# Patient Record
Sex: Female | Born: 1955 | Race: Black or African American | Hispanic: No | Marital: Single | Smoking: Never smoker
Health system: Southern US, Community
[De-identification: ages and names within clinical notes are randomized; demographics above are authoritative.]

## PROBLEM LIST (undated history)

## (undated) DIAGNOSIS — E7800 Pure hypercholesterolemia, unspecified: Secondary | ICD-10-CM

## (undated) DIAGNOSIS — I10 Essential (primary) hypertension: Secondary | ICD-10-CM

## (undated) DIAGNOSIS — M1990 Unspecified osteoarthritis, unspecified site: Secondary | ICD-10-CM

---

## 2013-09-14 ENCOUNTER — Encounter (HOSPITAL_COMMUNITY): Payer: Self-pay | Admitting: Emergency Medicine

## 2013-09-14 ENCOUNTER — Emergency Department (HOSPITAL_COMMUNITY)
Admission: EM | Admit: 2013-09-14 | Discharge: 2013-09-14 | Disposition: A | Discharge status: Home / Self Care | Payer: Medicaid Other | Attending: Emergency Medicine | Admitting: Emergency Medicine

## 2013-09-14 DIAGNOSIS — Z760 Encounter for issue of repeat prescription: Secondary | ICD-10-CM | POA: Diagnosis not present

## 2013-09-14 DIAGNOSIS — I10 Essential (primary) hypertension: Secondary | ICD-10-CM | POA: Diagnosis not present

## 2013-09-14 DIAGNOSIS — Z8639 Personal history of other endocrine, nutritional and metabolic disease: Secondary | ICD-10-CM | POA: Insufficient documentation

## 2013-09-14 DIAGNOSIS — Z862 Personal history of diseases of the blood and blood-forming organs and certain disorders involving the immune mechanism: Secondary | ICD-10-CM | POA: Diagnosis not present

## 2013-09-14 DIAGNOSIS — Z8739 Personal history of other diseases of the musculoskeletal system and connective tissue: Secondary | ICD-10-CM | POA: Diagnosis not present

## 2013-09-14 DIAGNOSIS — Z880 Allergy status to penicillin: Secondary | ICD-10-CM | POA: Diagnosis not present

## 2013-09-14 HISTORY — DX: Essential (primary) hypertension: I10

## 2013-09-14 HISTORY — DX: Pure hypercholesterolemia, unspecified: E78.00

## 2013-09-14 HISTORY — DX: Unspecified osteoarthritis, unspecified site: M19.90

## 2013-09-14 LAB — I-STAT CHEM 8, ED
BUN: 14 mg/dL (ref 6–23)
CHLORIDE: 104 meq/L (ref 96–112)
Calcium, Ion: 1.16 mmol/L (ref 1.12–1.23)
Creatinine, Ser: 1 mg/dL (ref 0.50–1.10)
Glucose, Bld: 108 mg/dL — ABNORMAL HIGH (ref 70–99)
HEMATOCRIT: 44 % (ref 36.0–46.0)
Hemoglobin: 15 g/dL (ref 12.0–15.0)
Potassium: 3.6 mEq/L — ABNORMAL LOW (ref 3.7–5.3)
Sodium: 140 mEq/L (ref 137–147)
TCO2: 23 mmol/L (ref 0–100)

## 2013-09-14 MED ORDER — AMLODIPINE BESYLATE 5 MG PO TABS: 5.0000 mg | ORAL_TABLET | Freq: Every day | ORAL | Status: AC

## 2013-09-14 MED ORDER — AMLODIPINE BESYLATE 5 MG PO TABS
5.0000 mg | ORAL_TABLET | Freq: Once | ORAL | Status: AC
  Administered 2013-09-14: 5 mg via ORAL
  Filled 2013-09-14 (×2): qty 1

## 2013-09-14 MED ORDER — SIMVASTATIN 20 MG PO TABS: 20.0000 mg | ORAL_TABLET | Freq: Every day | ORAL | Status: AC

## 2013-09-14 NOTE — ED Provider Notes (Signed)
Medical screening examination/treatment/procedure(s) were performed by non-physician practitioner and as supervising physician I was immediately available for consultation/collaboration.   EKG Interpretation None        David H Yao, MD 09/14/13 2349 

## 2013-09-14 NOTE — ED Notes (Signed)
EKG given to EDP, Yao, MD. For review. 

## 2013-09-14 NOTE — ED Notes (Signed)
Pt c/o increasing BP's x 2 weeks, pt states she has been increasing Lisinopril to 40mg s. Pt was taken off Lisinopril several months ago d/t rashes but is out of Amlodipine but has since ran out of that medication and cholesterol medication.  +HA, neck pain and blurred vision.

## 2013-09-14 NOTE — Discharge Instructions (Signed)
Please read and follow all provided instructions.  Your diagnoses today include:  1. Essential hypertension   2. Encounter for medication refill     Your blood pressure was high today (BP): BP 163/95   Pulse 69   Temp(Src) 98.7 F (37.1 C) (Oral)   Resp 17   Ht 5\' 3"  (1.6 m)   Wt 150 lb (68.04 kg)   BMI 26.58 kg/m2   SpO2 97%  Tests performed today include:  Vital signs. See below for your results today.   Medications prescribed:   Amlodipine - medication for high blood pressure   Simvastatin - medication for high cholesterol  Home care instructions:  Follow any educational materials contained in this packet.  Follow-up instructions: Please follow-up with your primary care provider in the next 7 days for a recheck of your symptoms and blood pressure.    Return instructions:   Please return to the Emergency Department if you experience worsening symptoms.   Return with severe chest pain, abdominal pain, or shortness of breath.   Return with severe headache, focal weakness, numbness, difficulty with speech or vision.  Return with loss of vision or sudden vision change.  Please return if you have any other emergent concerns.   Additional Information:  Your vital signs today were: BP 163/95   Pulse 69   Temp(Src) 98.7 F (37.1 C) (Oral)   Resp 17   Ht 5\' 3"  (1.6 m)   Wt 150 lb (68.04 kg)   BMI 26.58 kg/m2   SpO2 97% If your blood pressure (BP) was elevated above 135/85 this visit, please have this repeated by your doctor within one month. --------------

## 2013-09-14 NOTE — ED Provider Notes (Signed)
CSN: [id number]     Arrival date & time 09/14/13  1854 History   First MD Initiated Contact with Patient 09/14/13 1932     Chief Complaint  Patient presents with  . Hypertension     (Consider location/radiation/quality/duration/timing/severity/associated sxs/prior Treatment) HPI Comments: Patient with history of hypertension, hypercholesterolemia, osteoarthritis -- presents with complaint of elevated blood pressure. Patient has a 15 year history of hypertension and has been off amlodipine and simvastatin for 3 months. Patient had leftover lisinopril which she has been taking as a replacement. She has been monitoring her blood pressures closely and noted that they are still very elevated despite medication. This is what brings her to the emergency department tonight. Patient reports intermittent headaches for several weeks with blurry vision but no loss of vision. No thunderclap headache. No neck pain or fever. No significant chest pain or shortness of breath. No change in urination. Patient states that she has a primary care physician appointment on September 15 but was afraid to wait that long to restart her medications. The onset of this condition was acute. The course is constant. Aggravating factors: none. Alleviating factors: none.    Patient is a 58 y.o. female presenting with hypertension. The history is provided by the patient.  Hypertension Associated symptoms include headaches. Pertinent negatives include no abdominal pain, chest pain, coughing, fever, myalgias, nausea, neck pain, rash, sore throat or vomiting.    Past Medical History  Diagnosis Date  . Hypertension   . Hypercholesteremia   . Arthritis    History reviewed. No pertinent past surgical history. No family history on file. History  Substance Use Topics  . Smoking status: Never Smoker   . Smokeless tobacco: Not on file  . Alcohol Use: Yes     Comment: occasional   OB History   Grav Para Term Preterm Abortions  TAB SAB Ect Mult Living                 Review of Systems  Constitutional: Negative for fever.  HENT: Negative for rhinorrhea and sore throat.   Eyes: Positive for visual disturbance. Negative for redness.  Respiratory: Negative for cough.   Cardiovascular: Negative for chest pain.  Gastrointestinal: Negative for nausea, vomiting, abdominal pain and diarrhea.  Genitourinary: Negative for dysuria, frequency and difficulty urinating.  Musculoskeletal: Negative for myalgias and neck pain.  Skin: Negative for rash.  Neurological: Positive for headaches.   Allergies  Penicillins  Home Medications   Prior to Admission medications   Not on File   BP 193/111  Pulse 85  Temp(Src) 98.7 F (37.1 C) (Oral)  Resp 16  Ht 5\' 3"  (1.6 m)  Wt 150 lb (68.04 kg)  BMI 26.58 kg/m2  SpO2 99%  Physical Exam  Nursing note and vitals reviewed. Constitutional: She appears well-developed and well-nourished.  HENT:  Head: Normocephalic and atraumatic.  Eyes: Conjunctivae are normal. Right eye exhibits no discharge. Left eye exhibits no discharge.  Neck: Normal range of motion. Neck supple. No JVD present.  Cardiovascular: Normal rate, regular rhythm and normal heart sounds.   No murmur heard. Pulmonary/Chest: Effort normal and breath sounds normal. No respiratory distress. She has no wheezes. She has no rales.  Abdominal: Soft. There is no tenderness.  Neurological: She is alert.  Skin: Skin is warm and dry.  Psychiatric: She has a normal mood and affect.    ED Course  Procedures (including critical care time) Labs Review Labs Reviewed  I-STAT CHEM 8, ED - Abnormal; Notable  for the following:    Potassium 3.6 (*)    Glucose, Bld 108 (*)    All other components within normal limits    Imaging Review No results found.   EKG Interpretation None      8:13 PM Patient seen and examined. Work-up initiated. Medications ordered. Will check EKG and istat, will restart amlodipine and  simvastatin. PCP can titrate. Will have her avoid NSAIDs.   Vital signs reviewed and are as follows: BP 193/111  Pulse 85  Temp(Src) 98.7 F (37.1 C) (Oral)  Resp 16  Ht 5\' 3"  (1.6 m)  Wt 150 lb (68.04 kg)  BMI 26.58 kg/m2  SpO2 99%  9:36 PM EKG reviewed. No ischemic change. BP improved with amlodipine. Patient also notices improvement in HA and blurry vision. She is comfortable with d/c to home. Will give rx for simvastatin at home. She has follow-up on Sept 15th.   Patient urged to return with worsening symptoms or other concerns. Patient verbalized understanding and agrees with plan.   BP 163/95  Pulse 69  Temp(Src) 98.7 F (37.1 C) (Oral)  Resp 17  Ht 5\' 3"  (1.6 m)  Wt 150 lb (68.04 kg)  BMI 26.58 kg/m2  SpO2 97%   MDM   Final diagnoses:  Essential hypertension  Encounter for medication refill   HTN: No signs of end organ damage. HA but no deficits. Blurry vision improved with treatment, but no vision loss. No CP, SOB. BUN/creatinine OK. Will restart amlodipine. Continue lisinopril. Avoid NSAIDs and use tylenol for pain. Patient agrees.   No dangerous or life-threatening conditions suspected or identified by history, physical exam, and by work-up. No indications for hospitalization identified.      Renne Crigler, PA-C 09/14/13 2139

## 2014-02-12 ENCOUNTER — Other Ambulatory Visit: Payer: Self-pay | Admitting: Family Medicine

## 2014-02-12 DIAGNOSIS — N83201 Unspecified ovarian cyst, right side: Secondary | ICD-10-CM

## 2014-02-12 DIAGNOSIS — R1011 Right upper quadrant pain: Secondary | ICD-10-CM

## 2014-03-01 ENCOUNTER — Ambulatory Visit
Admission: RE | Admit: 2014-03-01 | Discharge: 2014-03-01 | Disposition: A | Discharge status: Home / Self Care | Payer: Medicaid Other | Source: Ambulatory Visit | Attending: Family Medicine | Admitting: Family Medicine

## 2014-03-01 ENCOUNTER — Other Ambulatory Visit: Payer: Self-pay | Admitting: Family Medicine

## 2014-03-01 DIAGNOSIS — R1011 Right upper quadrant pain: Secondary | ICD-10-CM

## 2014-03-01 DIAGNOSIS — N83201 Unspecified ovarian cyst, right side: Secondary | ICD-10-CM

## 2016-04-28 ENCOUNTER — Other Ambulatory Visit: Payer: Self-pay | Admitting: Family Medicine

## 2016-04-28 DIAGNOSIS — Z1231 Encounter for screening mammogram for malignant neoplasm of breast: Secondary | ICD-10-CM

## 2016-05-17 ENCOUNTER — Ambulatory Visit
Admission: RE | Admit: 2016-05-17 | Discharge: 2016-05-17 | Disposition: A | Discharge status: Home / Self Care | Payer: Medicare Other | Source: Ambulatory Visit | Attending: Family Medicine | Admitting: Family Medicine

## 2016-05-17 DIAGNOSIS — Z1231 Encounter for screening mammogram for malignant neoplasm of breast: Secondary | ICD-10-CM

## 2016-10-14 ENCOUNTER — Ambulatory Visit
Admission: RE | Admit: 2016-10-14 | Discharge: 2016-10-14 | Disposition: A | Discharge status: Home / Self Care | Payer: Medicare HMO | Source: Ambulatory Visit | Attending: Family Medicine | Admitting: Family Medicine

## 2016-10-14 ENCOUNTER — Other Ambulatory Visit: Payer: Self-pay | Admitting: Family Medicine

## 2016-10-14 DIAGNOSIS — M545 Low back pain: Secondary | ICD-10-CM

## 2016-11-17 ENCOUNTER — Ambulatory Visit
Admission: RE | Admit: 2016-11-17 | Discharge: 2016-11-17 | Disposition: A | Discharge status: Home / Self Care | Payer: Medicare HMO | Source: Ambulatory Visit | Attending: Allergy | Admitting: Allergy

## 2016-11-17 ENCOUNTER — Other Ambulatory Visit: Payer: Self-pay | Admitting: Allergy

## 2016-11-17 DIAGNOSIS — J309 Allergic rhinitis, unspecified: Secondary | ICD-10-CM

## 2019-08-15 ENCOUNTER — Other Ambulatory Visit: Payer: Self-pay | Admitting: Family Medicine

## 2019-08-15 DIAGNOSIS — Z1231 Encounter for screening mammogram for malignant neoplasm of breast: Secondary | ICD-10-CM

## 2019-08-24 ENCOUNTER — Other Ambulatory Visit: Payer: Self-pay

## 2019-08-24 ENCOUNTER — Ambulatory Visit
Admission: RE | Admit: 2019-08-24 | Discharge: 2019-08-24 | Disposition: A | Discharge status: Home / Self Care | Payer: Medicare HMO | Source: Ambulatory Visit | Attending: Family Medicine | Admitting: Family Medicine

## 2019-08-24 DIAGNOSIS — Z1231 Encounter for screening mammogram for malignant neoplasm of breast: Secondary | ICD-10-CM

## 2020-03-03 ENCOUNTER — Other Ambulatory Visit: Payer: Self-pay | Admitting: Family Medicine

## 2020-03-03 ENCOUNTER — Ambulatory Visit
Admission: RE | Admit: 2020-03-03 | Discharge: 2020-03-03 | Disposition: A | Discharge status: Home / Self Care | Payer: Medicare HMO | Source: Ambulatory Visit | Attending: Family Medicine | Admitting: Family Medicine

## 2020-03-03 DIAGNOSIS — M545 Low back pain, unspecified: Secondary | ICD-10-CM

## 2021-11-03 ENCOUNTER — Other Ambulatory Visit: Payer: Self-pay | Admitting: Family Medicine

## 2021-11-03 DIAGNOSIS — Z1231 Encounter for screening mammogram for malignant neoplasm of breast: Secondary | ICD-10-CM

## 2021-12-29 ENCOUNTER — Ambulatory Visit
Admission: RE | Admit: 2021-12-29 | Discharge: 2021-12-29 | Disposition: A | Discharge status: Home / Self Care | Payer: Medicare HMO | Source: Ambulatory Visit | Attending: Family Medicine | Admitting: Family Medicine

## 2021-12-29 DIAGNOSIS — Z1231 Encounter for screening mammogram for malignant neoplasm of breast: Secondary | ICD-10-CM

## 2022-04-20 ENCOUNTER — Other Ambulatory Visit: Payer: Self-pay | Admitting: Family Medicine

## 2022-04-20 DIAGNOSIS — R109 Unspecified abdominal pain: Secondary | ICD-10-CM

## 2022-05-18 ENCOUNTER — Other Ambulatory Visit: Payer: Medicare HMO

## 2022-06-02 ENCOUNTER — Ambulatory Visit
Admission: RE | Admit: 2022-06-02 | Discharge: 2022-06-02 | Disposition: A | Discharge status: Home / Self Care | Payer: Medicare HMO | Source: Ambulatory Visit | Attending: Family Medicine | Admitting: Family Medicine

## 2022-06-02 DIAGNOSIS — R109 Unspecified abdominal pain: Secondary | ICD-10-CM

## 2022-08-29 IMAGING — CR DG LUMBAR SPINE COMPLETE 4+V
5 series · 5 of 5 positions shown · non-contrast
Comparison: None.

CLINICAL DATA: Acute low back pain.

EXAM:
LUMBAR SPINE - COMPLETE 4+ VIEW

[t l-spine a.p.]
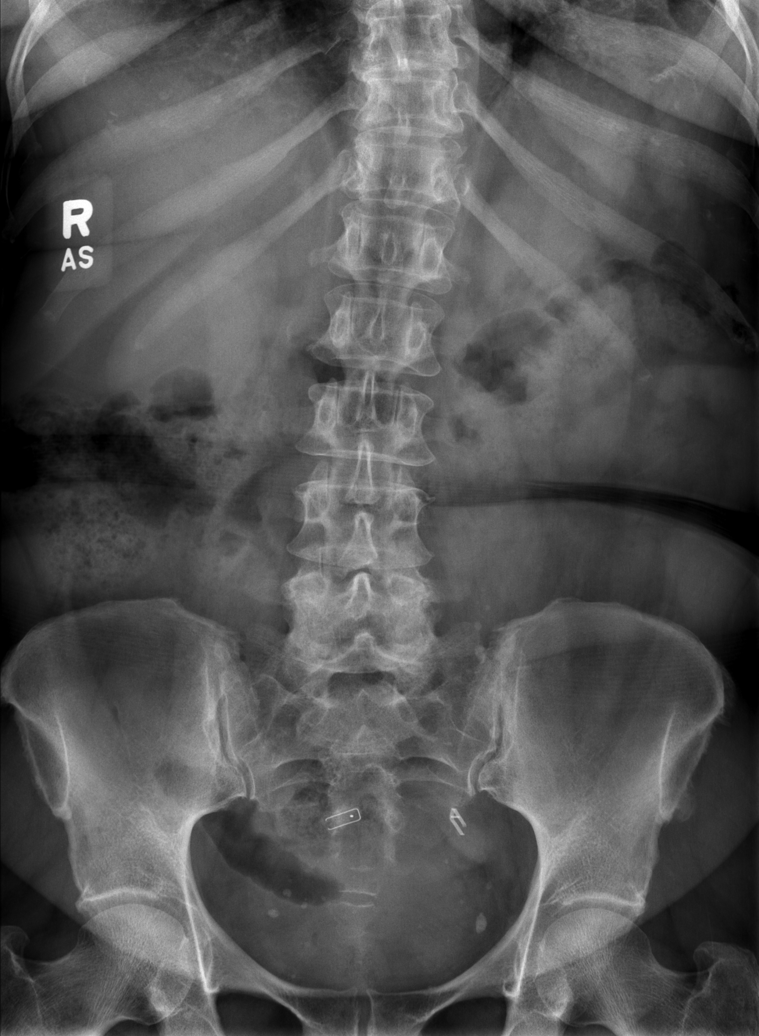

[t l-spine oblique exposure (1 of 2)]
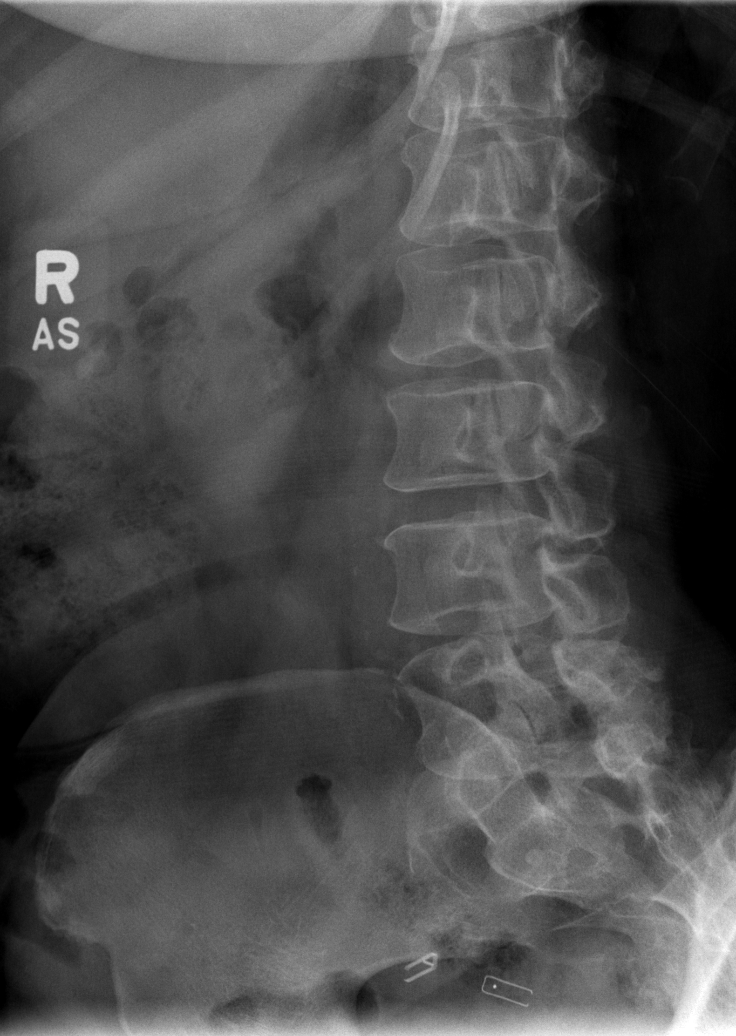

[t l-spine oblique exposure (2 of 2)]
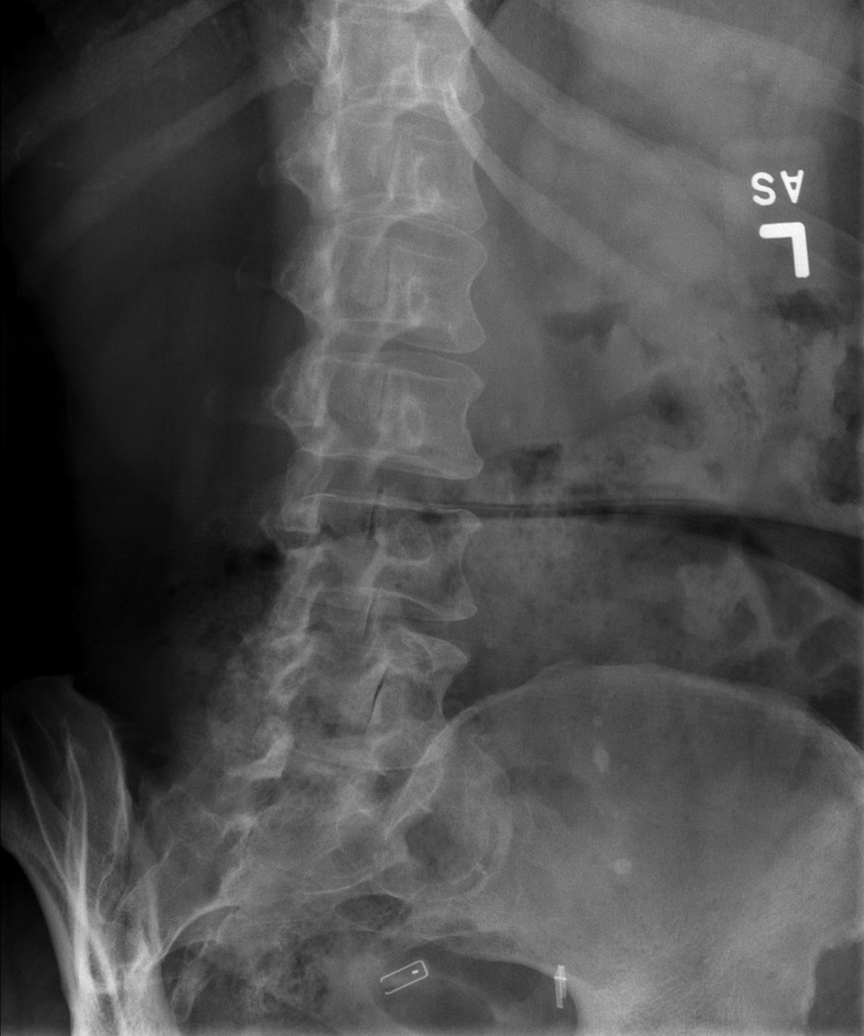

[t l-spine lat]
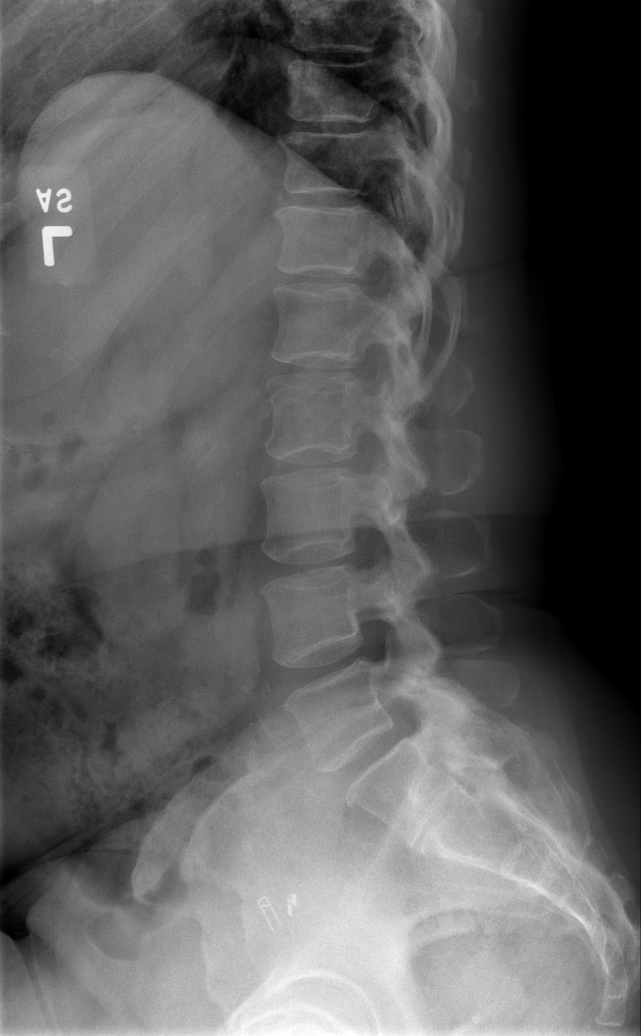

[t l-spine l5-s1 spot]
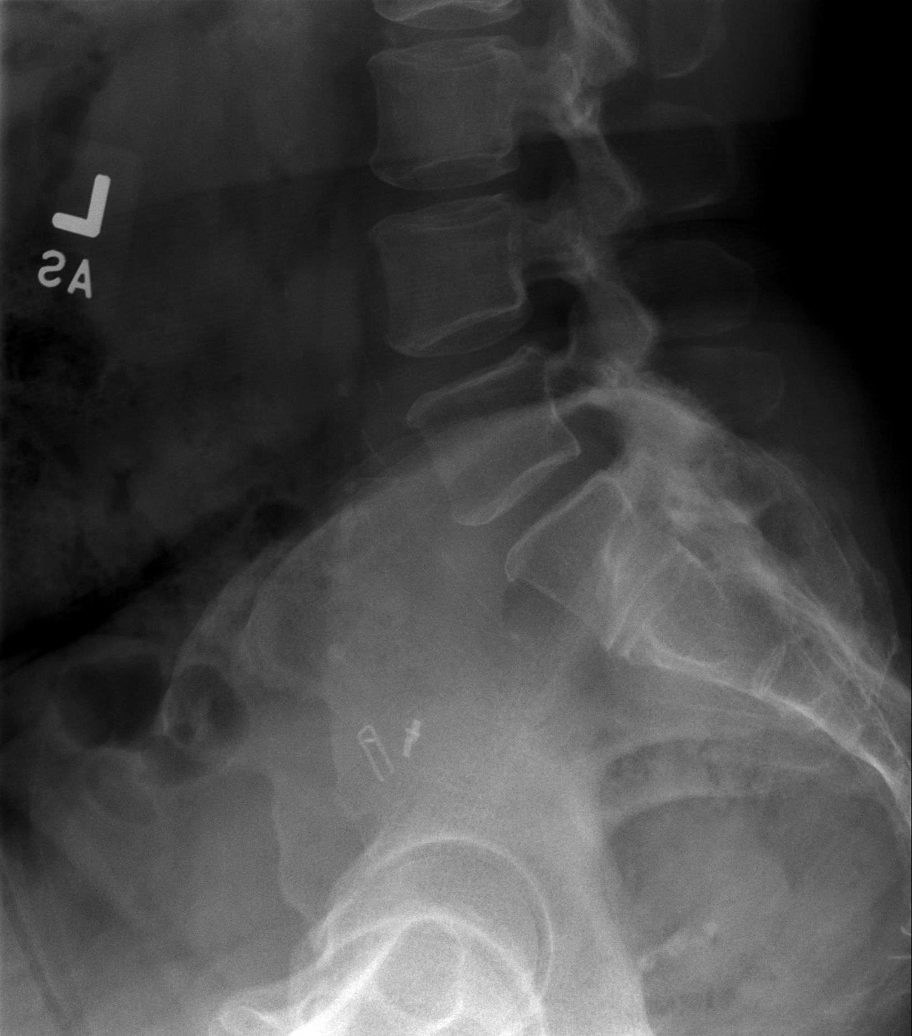

[5 of 5 positions shown; findings below may reference images not displayed]

FINDINGS: There is no evidence of lumbar spine fracture. Alignment is normal.
Intervertebral disc spaces are maintained. There is some minimal
facet degeneration at L5-S1.
IMPRESSION: Minimal facet degeneration L5-S1 bilaterally. Otherwise no acute
findings.
